# Patient Record
Sex: Female | Born: 2005 | Race: Black or African American | Hispanic: No | Marital: Single | Smoking: Never smoker
Health system: Southern US, Community
[De-identification: ages and names within clinical notes are randomized; demographics above are authoritative.]

## PROBLEM LIST (undated history)

## (undated) DIAGNOSIS — J45909 Unspecified asthma, uncomplicated: Secondary | ICD-10-CM

## (undated) DIAGNOSIS — L309 Dermatitis, unspecified: Secondary | ICD-10-CM

## (undated) DIAGNOSIS — L509 Urticaria, unspecified: Secondary | ICD-10-CM

## (undated) DIAGNOSIS — T783XXA Angioneurotic edema, initial encounter: Secondary | ICD-10-CM

## (undated) HISTORY — DX: Unspecified asthma, uncomplicated: J45.909

## (undated) HISTORY — DX: Urticaria, unspecified: L50.9

## (undated) HISTORY — DX: Dermatitis, unspecified: L30.9

## (undated) HISTORY — DX: Angioneurotic edema, initial encounter: T78.3XXA

---
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
## 2023-01-11 DIAGNOSIS — F330 Major depressive disorder, recurrent, mild: Secondary | ICD-10-CM | POA: Diagnosis not present

## 2023-01-21 DIAGNOSIS — F330 Major depressive disorder, recurrent, mild: Secondary | ICD-10-CM | POA: Diagnosis not present

## 2023-01-24 DIAGNOSIS — F330 Major depressive disorder, recurrent, mild: Secondary | ICD-10-CM | POA: Diagnosis not present

## 2023-01-28 DIAGNOSIS — F330 Major depressive disorder, recurrent, mild: Secondary | ICD-10-CM | POA: Diagnosis not present

## 2023-02-25 ENCOUNTER — Encounter (HOSPITAL_COMMUNITY): Payer: Self-pay | Admitting: Psychiatry

## 2023-02-25 ENCOUNTER — Ambulatory Visit (HOSPITAL_BASED_OUTPATIENT_CLINIC_OR_DEPARTMENT_OTHER): Payer: Medicaid Other | Admitting: Psychiatry

## 2023-02-25 DIAGNOSIS — F411 Generalized anxiety disorder: Secondary | ICD-10-CM | POA: Diagnosis not present

## 2023-02-25 NOTE — Progress Notes (Signed)
Psychiatric Initial Adult Assessment   Patient Identification: Rikia Trinkle MRN:  [id number] Date of Evaluation:  02/25/2023 Referral Source: PCP Chief Complaint:   Chief Complaint  Patient presents with   Establish Care   Visit Diagnosis:    ICD-10-CM   1. GAD (generalized anxiety disorder)  F41.1        Assessment:  Beverly Farley is a 17 y.o. female with a history of insomnia and vitamin D insufficiency who presents in person to [place] at [facility] for initial evaluation on 02/25/2023.    Patient reports experiencing symptoms of decreased sleep, brief periods of increased energy, increased irritability, and possible visual hallucinations in December 2023 lasting 2 to 3 days over a couple episodes.  She denies any reckless behaviors during these episodes and does note increased fatigue as they progressed.  Patient also endorsed a period of low mood in January 2024 secondary to interpersonal stressors.  During this episode she had low mood, fatigue, anhedonia, and amotivation.  She denied any SI/HI or thoughts of self-harm.  Of note patient does have a family history of bipolar disorder in her aunt and grandmother.  Patient was also screened for GAD and did endorse mild symptoms of anxiety.  Psychosocially patient has some instability at home and has not had a consistent bedroom/sleeping arrangement when not at school.  At this time patient meets criteria for mild GAD with further clarity needed to confirm/rule out bipolar/bipolar 2 disorder.  Medication options were discussed however patient declined at this time and will reassess if symptoms recur in the future.  She is connected with a therapist through school who she plans to continue with.  A number of assessments were performed during the evaluation today including  PHQ-9 which they scored a 1 on, GAD-7 which they scored a 6 on, and [place] suicide severity screening which showed no risk.    Plan: -  Continue Therapy through school counselor at [place] - TSH and lipid profile reviewed - Crisis resources reviewed - Follow up in 3-4 months  History of Present Illness:  Jola presents reporting that her PCP was concerned of Bipolar disorder when she was referred here.  Patient notes that at time of initial referral she had not slept for 3-day stretch in December later followed by a 2-day stretch.  There had also been periods before December where she was sleeping for 13-hour stretches.  During the episodes of decreased sleep she did notice having some increased energy, completing more school work, increased irritability, and possibly an episode of hallucinations where she saw footprints on the road while her sister was driving.  Patient reports that while she could not sleep during these episodes the primary reason seem to be her racing thoughts and she was fatigued the following days.  She does not identify any particular triggers for the onset of her symptoms.  The episode resolved by the end of December and patient denies any reoccurrence since.  She did endorse a period of depressed mood in January which she believes is related to interpersonal stressors.  At that time patient had decreased energy, spent more time in her room, lack of motivation, and anhedonia. Patient believes this is in line with her typical reaction to the stressors though does note this was the most severe episode of depression and she has had. In the past when she had felt more depressed she would take longer to complete assignments and may be late to school but would never missed  things completely.  The depressive symptoms resolved by the end of January/beginning of February and she denies ever experiencing any SI or thoughts of self-harm.  Patient had been connected with her RA for a wellness check and with a school counselor during that time.  She has another point with a counselor later this month.  We explored patient's  symptoms and lack of sleep that occurred in December.  As patient stated her mind had difficulty shutting down during those episodes, and while she did have increased energy initially that progressed to fatigue over the couple days of being awake.  It was also noted the patient had unstable housing situation as she had returned from college and no longer had a bedroom.  She ended up moving in with her sister which has been good though the 2 can butt heads.  Patient did note that there is a family history of bipolar disorder in her aunt and her grandmother.  On discussion of patient's symptoms patient expressed that there was some curiosity about why they occurred but denied significant concern.  She notes that she is now back to her baseline and has not had a reoccurrence of the events from December.  We explained that this could potentially be the onset of a bipolar disorder as she does have a family history, symptoms that are somewhat similar, and is in the right age range.  Patient acknowledged this and opted to hold off on medications for now while monitoring for if symptoms were to progress or recur.  We also did explore for anxiety disorders and while patient did score positive for mild anxiety she would prefer to manage it through counseling.  I would hold off on antidepressant due to this in addition to the potential bipolar disorder and risk an antidepressant exacerbating that.  Associated Signs/Symptoms: Depression Symptoms:  anhedonia, anxiety, (Hypo) Manic Symptoms:   None currently though did have decreased sleep and increased energy in December Anxiety Symptoms:  Excessive Worry, Psychotic Symptoms:   Denies PTSD Symptoms: NA  Past Psychiatric History: Patient denies prior psychiatric hospitalizations or suicide attempts. She denies having tried any medications in the past. Did start counseling through school in January has her second session in March.  She denies trying any past  medications.  Substance use denies everything other than caffeine.   Previous Psychotropic Medications: No   Substance Abuse History in the last 12 months:  No.  Consequences of Substance Abuse: NA  Past Medical History:  Past Medical History:  Diagnosis Date   Acute right-sided low back pain without sciatica 03/17/2020   Angioedema of lips 12/31/2018   Asthma    Eczema    Urticaria    No past surgical history on file.  Family Psychiatric History: Her aunt and grandma had a diagnosis of bipolar disorder. Dad has alcohol use disorder.  Family History:  Family History  Problem Relation Age of Onset   Asthma Mother    Fibromyalgia Mother    Obesity Mother    Sleep apnea Mother    GER disease Mother    Depression Mother    Thyroid disease Mother    Heart disease Father    Alcohol abuse Father    Asthma Sister    Fibroids Sister    Birth defects Sister    Bleeding Disorder Sister    Leukemia Sister    Heart disease Sister    Heart disease Maternal Grandmother    Depression Maternal Grandmother    Hyperlipidemia Maternal  Grandmother    Hypertension Maternal Grandfather    Diabetes Paternal Grandmother    Cirrhosis Paternal Grandmother    Cancer Other    Stroke Other     Social History:   Social History   Socioeconomic History   Marital status: Single    Spouse name: Not on file   Number of children: Not on file   Years of education: Not on file   Highest education level: Not on file  Occupational History   Not on file  Tobacco Use   Smoking status: Never   Smokeless tobacco: Never  Vaping Use   Vaping Use: Never used  Substance and Sexual Activity   Alcohol use: Never   Drug use: Never   Sexual activity: Not Currently  Other Topics Concern   Not on file  Social History Narrative   [name] lives with her mom & maternal grandmother.   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  She lives on campus during the semester and with her sister when not at school.  Patient notes that she moved in with her and when she was 13 due to difficulties at home with her parents and uncle.  After returning from first semester of college she lived with her aunt for a period before moving in with her sister due to not having a bedroom at her Aunts.   Allergies:   Allergies  Allergen Reactions   Other     Strong family history of Penicillin allergies, Mother requests that this patient not be given Penicillin.   Mother has hx of anaphylactic reaction, sister has hx of hives and rash.    Metabolic Disorder Labs: Lab Results  Component Value Date   HGBA1C 5.0 04/11/2021   MPG 97 04/11/2021   MPG 111 01/14/2020   No results found for: "PROLACTIN" Lab Results  Component Value Date   CHOL 125 04/11/2021   TRIG 89 10/10/2018   HDL 41 (L) 04/11/2021   CHOLHDL 2.9 10/10/2018   VLDL 11 05/07/2017   LDLCALC 70 10/10/2018   LDLCALC 83 05/07/2017   Lab Results  Component Value Date   TSH 2.760 12/05/2022    Therapeutic Level Labs: No results found for: "LITHIUM" No results found for: "CBMZ" No results found for: "VALPROATE"  Current Medications: Current Outpatient Medications  Medication Sig Dispense Refill   albuterol (VENTOLIN HFA) 108 (90 Base) MCG/ACT inhaler Inhale 1-2 puffs into the lungs every 6 (six) hours as needed for wheezing or shortness of breath. 6.7 g 0   cetirizine (ZYRTEC) 10 MG tablet Take one tablet by mouth once daily for management of hives and allergy symptoms 30 tablet 9    ibuprofen (ADVIL) 600 MG tablet Take 1 tablet (600 mg total) by mouth every 6 (six) hours as needed. 30 tablet 0   No current facility-administered medications for this visit.    Psychiatric Specialty Exam: Review of Systems  There were no vitals taken for this visit.There is  no height or weight on file to calculate BMI.  General Appearance: Well Groomed  Eye Contact:  Good  Speech:  Clear and Coherent and Normal Rate  Volume:  Normal  Mood:  Euthymic  Affect:  Congruent  Thought Process:  Coherent, Goal Directed, and Linear  Orientation:  Full (Time, Place, and Person)  Thought Content:  Logical  Suicidal Thoughts:  No  Homicidal Thoughts:  No  Memory:  Immediate;   Good  Judgement:  Good  Insight:  Fair  Psychomotor Activity:  Normal  Concentration:  Concentration: Good  Recall:  Good  Fund of Knowledge:Fair  Language: Good  Akathisia:  NA    AIMS (if indicated):  not done  Assets:  Communication [place] Talents/Skills Vocational/Educational  ADL's:  Intact  Cognition: WNL  Sleep:  Good   Screenings: GAD-7    Flowsheet Row Office Visit from 02/25/2023 in [facility]  Total GAD-7 Score 6      PHQ2-9    [place] Office Visit from 02/25/2023 in [facility] Office Visit from 12/05/2022 in [place] Office Visit from 04/16/2022 in [place] [facility] [place] [facility] Office Visit from 04/07/2021 in [place] [facility] from 04/25/2020 in [place] at [facility] Total Score '1 4 2 2 1  '$ PHQ-9 Total Score -- '17 10 10 '$ --      [place] Office Visit from 02/25/2023 in [facility] ED from 12/13/2022 in [facility] [place] No Risk No  Risk        Collaboration of Care: Primary Care Provider AEB chart review  Patient/Guardian was advised Release of Information must be obtained prior to any record release in order to collaborate their care with an outside provider. Patient/Guardian was advised if they have not already done so to contact the registration department to sign all necessary forms in order for [place] to release information regarding their care.   Consent: Patient/Guardian gives verbal consent for treatment and assignment of benefits for services provided during this visit. Patient/Guardian expressed understanding and agreed to proceed.   Vista Mink, MD 3/11/20241:45 PM  65 minutes were spent in chart review, interview, psycho education, counseling, medical decision making, coordination of care and long-term prognosis.  Patient was given opportunity to ask question and all concerns and questions were addressed and answers. Excluding separately billable services.   Virtual Visit via Video Note  I connected with [place] [facility] on 02/25/23 at  1:10 PM EDT by a video enabled telemedicine application and verified that I am speaking with the correct person using two identifiers.  Location: Patient: Home Provider: Home office   I discussed the limitations of evaluation and management by telemedicine and the availability of in person appointments. The patient expressed understanding and agreed to proceed.   I discussed the assessment and treatment plan with the patient. The patient was provided an opportunity to ask questions and all were answered. The patient agreed with the plan and demonstrated an understanding of the instructions.   The patient was advised to call back or seek an in-person evaluation if the symptoms worsen or if the condition fails to improve as anticipated.  I provided 45 minutes of non-face-to-face time during this encounter.     Vista Mink, MD  3/11/20242:07 PM

## 2023-04-08 ENCOUNTER — Ambulatory Visit (HOSPITAL_COMMUNITY): Payer: Medicaid Other | Admitting: Psychiatry

## 2023-05-28 ENCOUNTER — Encounter (HOSPITAL_COMMUNITY): Payer: Self-pay

## 2023-05-28 ENCOUNTER — Encounter (HOSPITAL_COMMUNITY): Payer: Medicaid Other | Admitting: Psychiatry

## 2023-05-28 NOTE — Progress Notes (Signed)
This encounter was created in error - please disregard.

## 2023-05-28 NOTE — Progress Notes (Deleted)
BH MD/PA/NP OP Progress Note  05/28/2023 9:27 AM Beverly Farley  MRN:  [id number]  Visit Diagnosis: No diagnosis found.  Assessment: Beverly Farley is a 17 y.o. female with a history of insomnia and vitamin D insufficiency who presented to [facility] at [facility] for initial evaluation on 02/25/2023.    At initial evaluation patient reported experiencing symptoms of decreased sleep, brief periods of increased energy, increased irritability, and possible visual hallucinations in December 2023 lasting 2 to 3 days over a couple episodes.  She denied any reckless behaviors during these episodes and does note increased fatigue as they progressed.  Patient also endorsed a period of low mood in January 2024 secondary to interpersonal stressors.  During this episode she had low mood, fatigue, anhedonia, and amotivation.  She denied any SI/HI or thoughts of self-harm.  Of note patient does have a family history of bipolar disorder in her aunt and grandmother.  Patient was also screened for GAD and did endorse mild symptoms of anxiety.  Psychosocially patient has some instability at home and has not had a consistent bedroom/sleeping arrangement when not at school.  Patient met criteria for mild GAD with further clarity needed to confirm/rule out bipolar/bipolar 2 disorder.  Medication options were discussed however patient declined at this time and will reassess if symptoms recur in the future.  She is connected with a therapist through school who she plans to continue with.  Beverly Farley presents for follow-up evaluation. Today, 05/28/23, patient reports ***  Plan: - Continue Therapy through school counselor at [facility] - TSH and lipid profile reviewed - Crisis resources reviewed - Follow up in 3-4 months    Chief Complaint: No chief complaint on file.  HPI: ***   Past Psychiatric History: Patient denies prior psychiatric hospitalizations or suicide attempts. She denies  having tried any medications in the past. Did start counseling through school in January 2024 has her second session in March.  She denies trying any past medications.  Substance use denies everything other than caffeine.   Past Medical History:  Past Medical History:  Diagnosis Date   Acute right-sided low back pain without sciatica 03/17/2020   Angioedema of lips 12/31/2018   Asthma    Eczema    Urticaria    No past surgical history on file.  Family History:  Family History  Problem Relation Age of Onset   Asthma Mother    Fibromyalgia Mother    Obesity Mother    Sleep apnea Mother    GER disease Mother    Depression Mother    Thyroid disease Mother    Heart disease Father    Alcohol abuse Father    Asthma Sister    Fibroids Sister    Birth defects Sister    Bleeding Disorder Sister    Leukemia Sister    Heart disease Sister    Heart disease Maternal Grandmother    Depression Maternal Grandmother    Hyperlipidemia Maternal Grandmother    Hypertension Maternal Grandfather    Diabetes Paternal Grandmother    Cirrhosis Paternal Grandmother    Cancer Other    Stroke Other     Social History:  Social History   Socioeconomic History   Marital status: Single    Spouse name: Not on file   Number of children: Not on file   Years of education: Not on file   Highest education level: Not on file  Occupational History   Not on file  Tobacco Use  Smoking status: Never   Smokeless tobacco: Never  Vaping Use   Vaping Use: Never used  Substance and Sexual Activity   Alcohol use: Never   Drug use: Never   Sexual activity: Not Currently  Other Topics Concern   Not on file  Social History Narrative   [name] lives with her mom & maternal grandmother.   Social Determinants of Health   Financial Resource Strain: Not on file  Food Insecurity: No Food Insecurity (01/16/2020)   Hunger Vital Sign    Worried About Running Out of Food in the Last Year: Never true     Ran Out of Food in the Last Year: Never true  Transportation Needs: Not on file  Physical Activity: Not on file  Stress: Not on file  Social Connections: Not on file    Allergies:  Allergies  Allergen Reactions   Other     Strong family history of Penicillin allergies, Mother requests that this patient not be given Penicillin.   Mother has hx of anaphylactic reaction, sister has hx of hives and rash.    Current Medications: Current Outpatient Medications  Medication Sig Dispense Refill   albuterol (VENTOLIN HFA) 108 (90 Base) MCG/ACT inhaler Inhale 1-2 puffs into the lungs every 6 (six) hours as needed for wheezing or shortness of breath. 6.7 g 0   cetirizine (ZYRTEC) 10 MG tablet Take one tablet by mouth once daily for management of hives and allergy symptoms 30 tablet 9   ibuprofen (ADVIL) 600 MG tablet Take 1 tablet (600 mg total) by mouth every 6 (six) hours as needed. 30 tablet 0   No current facility-administered medications for this visit.     Psychiatric Specialty Exam: Review of Systems  There were no vitals taken for this visit.There is no height or weight on file to calculate BMI.  General Appearance: {Appearance:22683}  Eye Contact:  {BHH EYE CONTACT:22684}  Speech:  {Speech:22685}  Volume:  {Volume (PAA):22686}  Mood:  {BHH MOOD:22306}  Affect:  {Affect (PAA):22687}  Thought Process:  {Thought Process (PAA):22688}  Orientation:  {BHH ORIENTATION (PAA):22689}  Thought Content: {Thought Content:22690}   Suicidal Thoughts:  {ST/HT (PAA):22692}  Homicidal Thoughts:  {ST/HT (PAA):22692}  Memory:  {BHH MEMORY:22881}  Judgement:  {Judgement (PAA):22694}  Insight:  {Insight (PAA):22695}  Psychomotor Activity:  {Psychomotor (PAA):22696}  Concentration:  {Concentration:21399}  Recall:  {BHH GOOD/FAIR/POOR:22877}  Fund of Knowledge: {BHH GOOD/FAIR/POOR:22877}  Language: {BHH GOOD/FAIR/POOR:22877}  Akathisia:  {BHH YES OR NO:22294}    AIMS (if indicated): {Desc;  done/not:10129}  Assets:  {Assets (PAA):22698}  ADL's:  {BHH ZOX'W:96045}  Cognition: {chl bhh cognition:304700322}  Sleep:  {BHH GOOD/FAIR/POOR:22877}   Metabolic Disorder Labs: Lab Results  Component Value Date   HGBA1C 5.0 04/11/2021   MPG 97 04/11/2021   MPG 111 01/14/2020   No results found for: "PROLACTIN" Lab Results  Component Value Date   CHOL 125 04/11/2021   TRIG 89 10/10/2018   HDL 41 (L) 04/11/2021   CHOLHDL 2.9 10/10/2018   VLDL 11 05/07/2017   LDLCALC 70 10/10/2018   LDLCALC 83 05/07/2017   Lab Results  Component Value Date   TSH 2.760 12/05/2022   TSH 0.790 01/01/2019    Therapeutic Level Labs: No results found for: "LITHIUM" No results found for: "VALPROATE" No results found for: "CBMZ"   Screenings: GAD-7    Flowsheet Row [facility]  Total GAD-7 Score 6      PHQ2-9    Flowsheet  Row Office Visit from 02/25/2023 in [facility] Office Visit from 12/05/2022 in [facility] Office Visit from 04/16/2022 in [place] [facility] Office Visit from 04/07/2021 in [facility] Nutrition from 04/25/2020 in [facility] at [facility] Total Score 1 4 2 2 1   PHQ-9 Total Score -- 17 10 10  --      Flowsheet Row [facility] from 12/13/2022 in [facility] RISK CATEGORY No Risk No Risk       Collaboration of Care: Collaboration of Care: [facility] OP Collaboration of Care:[id number]}  Patient/Guardian was advised Release of Information must be obtained prior to any record release in order to collaborate their care with an outside provider. Patient/Guardian was advised if they have not already done  so to contact the registration department to sign all necessary forms in order for [place] to release information regarding their care.   Consent: Patient/Guardian gives verbal consent for treatment and assignment of benefits for services provided during this visit. Patient/Guardian expressed understanding and agreed to proceed.    Stasia Cavalier, MD 05/28/2023, 9:27 AM   Virtual Visit via Video Note  I connected with Beverly Farley on 05/28/23 at  2:00 PM EDT by a video enabled telemedicine application and verified that I am speaking with the correct person using two identifiers.  Location: Patient: Home Provider: Home Office   I discussed the limitations of evaluation and management by telemedicine and the availability of in person appointments. The patient expressed understanding and agreed to proceed.   I discussed the assessment and treatment plan with the patient. The patient was provided an opportunity to ask questions and all were answered. The patient agreed with the plan and demonstrated an understanding of the instructions.   The patient was advised to call back or seek an in-person evaluation if the symptoms worsen or if the condition fails to improve as anticipated.  I provided *** minutes of non-face-to-face time during this encounter.   Stasia Cavalier, MD

## 2023-06-14 ENCOUNTER — Ambulatory Visit (HOSPITAL_COMMUNITY)
Admission: RE | Admit: 2023-06-14 | Discharge: 2023-06-14 | Disposition: A | Discharge status: Home / Self Care | Payer: Medicaid Other | Source: Ambulatory Visit | Attending: Physician Assistant | Admitting: Physician Assistant

## 2023-06-14 ENCOUNTER — Encounter (HOSPITAL_COMMUNITY): Payer: Self-pay

## 2023-06-14 VITALS — BP 98/65 | HR 77 | Temp 99.4°F | Resp 18 | Ht 63.0 in | Wt 226.0 lb

## 2023-06-14 DIAGNOSIS — M25562 Pain in left knee: Secondary | ICD-10-CM

## 2023-06-14 DIAGNOSIS — M545 Low back pain, unspecified: Secondary | ICD-10-CM | POA: Diagnosis not present

## 2023-06-14 DIAGNOSIS — M25561 Pain in right knee: Secondary | ICD-10-CM

## 2023-06-14 MED ORDER — METHOCARBAMOL 500 MG PO TABS: 500.0000 mg | ORAL_TABLET | Freq: Two times a day (BID) | ORAL | 0 refills | Status: AC

## 2023-06-14 MED ORDER — NAPROXEN 375 MG PO TABS: 375.0000 mg | ORAL_TABLET | Freq: Two times a day (BID) | ORAL | 0 refills | Status: DC

## 2023-06-14 NOTE — Discharge Instructions (Signed)
I am very glad to hear that you have an appointment with orthopedics coming up.  Keep this appointment as scheduled.  Use heat, rest, stretch for additional symptom relief.  Take Naprosyn twice daily.  Do not take additional NSAIDs with this medication including aspirin, ibuprofen/Advil, naproxen/Aleve.  Take Robaxin up to twice a day.  This will make you sleepy so do not drive or drink alcohol with taking it.  Someone should contact you to schedule appoint with primary care.  If you have any worsening symptoms including weakness, numbness or tingling in your legs, going to the bathroom on yourself without noticing it you need to go to the emergency room immediately.
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
## 2023-06-21 ENCOUNTER — Encounter (HOSPITAL_COMMUNITY): Payer: Self-pay

## 2023-07-05 ENCOUNTER — Encounter (HOSPITAL_COMMUNITY): Payer: Self-pay

## 2024-05-26 ENCOUNTER — Encounter: Payer: Self-pay | Admitting: *Deleted

## 2024-06-10 ENCOUNTER — Ambulatory Visit: Admitting: Nurse Practitioner

## 2024-09-11 ENCOUNTER — Ambulatory Visit (INDEPENDENT_AMBULATORY_CARE_PROVIDER_SITE_OTHER): Payer: Self-pay | Admitting: Nurse Practitioner

## 2024-09-11 ENCOUNTER — Encounter: Payer: Self-pay | Admitting: Nurse Practitioner

## 2024-09-11 VITALS — BP 97/55 | HR 63 | Temp 96.8°F | Ht 63.0 in | Wt 190.0 lb

## 2024-09-11 DIAGNOSIS — R112 Nausea with vomiting, unspecified: Secondary | ICD-10-CM

## 2024-09-11 DIAGNOSIS — Z13228 Encounter for screening for other metabolic disorders: Secondary | ICD-10-CM

## 2024-09-11 DIAGNOSIS — E669 Obesity, unspecified: Secondary | ICD-10-CM

## 2024-09-11 DIAGNOSIS — Z1329 Encounter for screening for other suspected endocrine disorder: Secondary | ICD-10-CM

## 2024-09-11 DIAGNOSIS — F419 Anxiety disorder, unspecified: Secondary | ICD-10-CM

## 2024-09-11 DIAGNOSIS — N946 Dysmenorrhea, unspecified: Secondary | ICD-10-CM

## 2024-09-11 DIAGNOSIS — R55 Syncope and collapse: Secondary | ICD-10-CM

## 2024-09-11 DIAGNOSIS — M25561 Pain in right knee: Secondary | ICD-10-CM

## 2024-09-11 DIAGNOSIS — Z130 Encounter for screening for diseases of the blood and blood-forming organs and certain disorders involving the immune mechanism: Secondary | ICD-10-CM

## 2024-09-11 DIAGNOSIS — G8929 Other chronic pain: Secondary | ICD-10-CM

## 2024-09-11 DIAGNOSIS — F32A Depression, unspecified: Secondary | ICD-10-CM

## 2024-09-11 DIAGNOSIS — F1290 Cannabis use, unspecified, uncomplicated: Secondary | ICD-10-CM

## 2024-09-11 DIAGNOSIS — M25562 Pain in left knee: Secondary | ICD-10-CM

## 2024-09-11 DIAGNOSIS — Z1321 Encounter for screening for nutritional disorder: Secondary | ICD-10-CM

## 2024-09-11 DIAGNOSIS — M545 Low back pain, unspecified: Secondary | ICD-10-CM

## 2024-09-11 MED ORDER — IBUPROFEN 600 MG PO TABS: 600.0000 mg | ORAL_TABLET | Freq: Three times a day (TID) | ORAL | 0 refills | Status: AC | PRN

## 2024-09-11 MED ORDER — FLUOXETINE HCL 20 MG PO CAPS: 20.0000 mg | ORAL_CAPSULE | Freq: Every day | ORAL | 3 refills | Status: AC

## 2024-09-11 MED ORDER — ONDANSETRON 4 MG PO TBDP: 4.0000 mg | ORAL_TABLET | Freq: Three times a day (TID) | ORAL | 1 refills | Status: AC | PRN

## 2024-09-11 NOTE — Assessment & Plan Note (Addendum)
 Dysmenorrhea with associated nausea and vomiting Severe dysmenorrhea with nausea and vomiting, unresponsive to over-the-counter medications due to vomiting. Menstrual cycles regular, lasting 5-6 days, lightened over the years. - Refer to gynecologist for further evaluation and management. - Advise use of heating pad and warm fluids for symptomatic relief. Prescribed ibuprofen  600 mg 3 times daily as needed menstrual cramps
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
## 2024-09-11 NOTE — Assessment & Plan Note (Signed)
 Syncope (recurrent fainting episodes) Recurrent syncope since last year, most recent episode a couple of months ago. Episodes preceded by lightheadedness and peripheral vision loss. No chest pain or shortness of breath. Differential includes anxiety or depression-related syncope. - Order laboratory tests to evaluate potential causes of syncope. - Perform EKG to assess cardiac function.  EKG showed sinus bradycardia rate of 59,  - Advise maintaining hydration with 64 ounces of water daily.

## 2024-09-11 NOTE — Assessment & Plan Note (Signed)
 Nausea with dysmenorrhea Prescribed Zofran  4 mg every 8 hours as needed nausea

## 2024-09-11 NOTE — Assessment & Plan Note (Signed)
 Chronic low back pain with history of lumbar disc herniation and bilateral knee pain likely due to osteoarthritis Chronic low back pain with lumbar disc herniation and bilateral knee pain, likely due to osteoarthritis. Pain exacerbated by cold weather and rain, currently at 1/10 severity. Previous physical therapy ineffective, high medication tolerance, ibuprofen  800 mg ineffective, lidocaine patches provide relief. - Refer to orthopedics for further evaluation and management. - Recommend Tylenol  650 mg every six hours as needed for pain.

## 2024-09-11 NOTE — Assessment & Plan Note (Signed)
  Reports use of marijuana, advised against use due to potential health risks. - Advise discontinuation of marijuana use.

## 2024-09-11 NOTE — Patient Instructions (Addendum)
 Ok to take ibuprofen   600 mg every  8 hours as needed for menstrual cramps , take with food.  Take zofran  4 mg every 8 hours as needed for nausea    1. Screening for endocrine, nutritional, metabolic and immunity disorder (Primary)  - CBC - CMP14+EGFR - TSH  2. Syncope, unspecified syncope type  - EKG 12-Lead  3. Anxiety and depression  - FLUoxetine  (PROZAC ) 20 MG capsule; Take 1 capsule (20 mg total) by mouth daily.  Dispense: 90 capsule; Refill: 3 - AMB Referral VBCI Care Management  4. Chronic pain of both knees  - Ambulatory referral to Orthopedic Surgery - ibuprofen  (ADVIL ) 600 MG tablet; Take 1 tablet (600 mg total) by mouth every 8 (eight) hours as needed.  Dispense: 30 tablet; Refill: 0  5. Chronic low back pain without sciatica, unspecified back pain laterality  - Ambulatory referral to Orthopedic Surgery - ibuprofen  (ADVIL ) 600 MG tablet; Take 1 tablet (600 mg total) by mouth every 8 (eight) hours as needed.  Dispense: 30 tablet; Refill: 0  6. Dysmenorrhea  - Ambulatory referral to Gynecology - ondansetron  (ZOFRAN -ODT) 4 MG disintegrating tablet; Take 1 tablet (4 mg total) by mouth every 8 (eight) hours as needed for nausea or vomiting.  Dispense: 20 tablet; Refill: 1 - ibuprofen  (ADVIL ) 600 MG tablet; Take 1 tablet (600 mg total) by mouth every 8 (eight) hours as needed.  Dispense: 30 tablet; Refill: 0  7. Nausea and vomiting, unspecified vomiting type  - ondansetron  (ZOFRAN -ODT) 4 MG disintegrating tablet; Take 1 tablet (4 mg total) by mouth every 8 (eight) hours as needed for nausea or vomiting.  Dispense: 20 tablet; Refill: 1    Behavioral Health Resources:    What if I or someone I know is in crisis?   If you are thinking about harming yourself or having thoughts of suicide, or if you know someone who is, seek help right away.   Call your doctor or mental health care provider.   Call 911 or go to a hospital emergency room to get immediate help, or ask a  friend or family member to help you do these things; IF YOU ARE IN [place], YOU MAY GO TO WALK-IN URGENT CARE 24/7 at [facility] (see below)   Call the [place]  National Suicide Prevention Lifeline's toll-free, 24-hour hotline at 1-800-273-TALK (331) 334-8745) or TTY: 1-800-799-4 TTY 9052276131) to talk to a trained counselor.   If you are in crisis, make sure you are not left alone.    If someone else is in crisis, make sure he or she is not left alone     24 Hour :    [facility]  [address], [place], [place] [address] [phone] or [phone] WALK-IN URGENT CARE 24/7   [facility] Mobile Crisis: [phone]   [place]  National Suicide Hotline: 612-401-5495   [facility] [employer] (Domestic Violence, Rape & Victim Assistance)  [phone]   [facility]  [address], [place]  [address]   [phone] or [phone]    [facility] [employer] [facility]: [phone] (8am-4pm) or [phone] (after hours)          [facility], [address], [place], [place]  [phone] Fax: [phone] [email or web address] *Interpreters available *Accepts all insurance and uninsured for Urgent Care needs *Accepts Medicaid and uninsured for outpatient treatment    [facility]   Mon-Fri:  8am-5pm [address], [place], [place] [phone](eynwz); [phone](fax) [email or web address]  *Accepts Medicare   [facility] Pablo Earlean Everts, Fri: 8am-4pm [address], [place], [place] [address] [phone] (phone); [phone] (fax) [email or web address]  *Accepts Medicare   [facility] Mon-Fri: 9am-5pm  [address], [place], [place] [phone] (phone); [phone]   [email or web address]  *Accepts Medicaid   [facility], 8:30am-12pm/1pm-2:30pm [address], [place], [place] [phone] (phone); [phone] (fax) [email or web address].[email or web address]  *Accepts Medicaid, sliding-scale*Bilingual services available   [facility] Mon-Fri, 8am-7pm [address], [place], [place]  [phone](eynwz); [phone](fax) www.[email or web address]  *Accepts Medicaid *Bilingual services available   [facility] Mon-Fri: 8am-5pm, Saturday by appointment only [address] [place], [place], [place] [phone] (phone); [phone] (fax) [email or web address]    [facility] [address], [place], [place] [phone] [email or web address]  *Free & reduced services for uninsured and underinsured individuals *Bilingual services for Spanish-speaking clients 21 and under   [facility], [address] [place], [place] [phone](eynwz); [phone](fax) [email or web address]  *Bring your own interpreter at first visit *Accepts Medicare and [facility]   [facility] Mon-Fri: 9am-5:30pm [address], [place], [place] [phone] (phone), [phone] (fax) After hours crisis line: [phone] [email or web address]  *Accepts Medicare and Medicaid   [employer], 8am-6pm [address] [place], [place]  [phone] (phone); [phone] (fax) [email or web address]://[email or web address]  *Subsidized costs available   [facility] Mon-Fri: 8am-4pm [address], [place], [place] [phone](eynwz); [phone](fax) [email or web address]  *Accepts Medicaid   [facility] Same day access hours: Mon-Fri, 8:30-3pm Crisis hours: Mon-Fri, 8am-5pm [address], [place], [place] [phone]    [facility] [employer] Same day access hours: Mon-Fri, 8:30-3pm Crisis hours: Mon-Fri, 8am-8pm [address] [place], [place] [phone] (phone); [phone] (fax) www.[email or web address]  *Accepts Medicaid and Medicare   [facility], [place], Fri: 9am-9pm Tues, Thurs: 9am-6pm [address] [place], [place], [place]  [phone] (phone); [phone] (fax) [email or web address]  *(Accepts Medicare and Medicaid; payment plans available)*Bilingual services available   [facility] [address] [place], [place] [phone] (phone); [phone] (fax) [email or web address]    [facility] [address], [place], [place]  [phone]  [email or web address]  *Bilingual services available   [facility]) Mon-Thurs: 8am-8pm [address], [place], [place] [phone] (phone); [phone] (fax) [email or web address]  *Accepts Medicaid*Bilingual services available   [facility] [address] [place], [place] [phone] (phone) [email or web address]  *Accepts Medicaid *Bilingual services available   [facility] Mon-Fri, 9am-4:45pm [address], [place], [place] [phone] (phone); [phone] (fax) [email or web address]  *Accepts Medicare   [facility] Mon-Thurs: 8:30-8pm, Fri: 8:30am-7pm [address], [place], [place] (3rd floor) [phone] (phone); [phone] (fax) [email or web address]  *Accepts Medicaid; income-based reduced rates available   [facility] Mon-Fri: 8am-5pm [address], [place], [place] [address] [phone] (phone); [phone] (fax) [email or web address]  *Accepts Medicaid*Bilingual services available     [facility] [place])  [address], [place] [phone]  www.[email or web address]  *Provides direct services to individuals in recovery from mental illness, including support groups, recovery skills classes, and one on one peer support   [employer] on Mental Illness) [place] [facility] helpline: (445)373-2054  NAMI  helpline: 949-219-5482 https://[email or web address]  *A community hub for information relating to local resources and services for the friends and families of individuals living alongside a mental health condition, as well as the individuals themselves.  Classes and support groups also provided             It is important that you exercise regularly at least 30 minutes 5 times a week as tolerated  Think about what you will eat, plan ahead. Choose  clean, green, fresh or frozen over canned, processed or packaged foods which are more sugary, salty and fatty. 70 to 75% of food eaten should be vegetables and fruit. Three meals at set times with snacks allowed between meals, but they must be fruit or vegetables. Aim to eat over a 12 hour period , example 7 am to 7 pm, and STOP after  your last meal of the day. Drink water,generally about 64 ounces per day, no other drink is as healthy. Fruit juice is best enjoyed in a healthy way, by EATING the fruit.  Thanks for choosing [facility] we consider it a privelige to serve you.

## 2024-09-11 NOTE — Assessment & Plan Note (Addendum)
 Flowsheet Row Office Visit from 09/11/2024 in [facility] - A Dept Of [facility]  PHQ-9 Total Score 11       09/11/2024   11:59 AM 02/25/2023    1:42 PM  GAD 7 : Generalized Anxiety Score  Nervous, Anxious, on Edge 2   Control/stop worrying 0   Worry too much - different things 1   Trouble relaxing 1   Restless 1   Easily annoyed or irritable 1   Afraid - awful might happen 1   Total GAD 7 Score 7   Anxiety Difficulty Very difficult      Information is confidential and restricted. Go to Review Flowsheets to unlock data.   Major depressive disorder and anxiety disorder. Previous therapy discontinued due to confidentiality concerns. Open to medication and therapy. Prozac  initiated, potential side effects include nausea and vomiting. Full therapeutic effect expected in 4-6 weeks. Urgent care options discussed for self-harm prevention. - Prescribe Prozac  20mg  daily for depression and anxiety. - Refer to counseling for therapy. - Advise seeking urgent care or calling 911 if experiencing thoughts of self-harm.

## 2024-09-12 LAB — CMP14+EGFR
ALT: 6 IU/L (ref 0–32)
AST: 16 IU/L (ref 0–40)
Albumin: 4.1 g/dL (ref 4.0–5.0)
Alkaline Phosphatase: 48 IU/L (ref 42–106)
BUN/Creatinine Ratio: 11 (ref 9–23)
BUN: 7 mg/dL (ref 6–20)
Bilirubin Total: 0.3 mg/dL (ref 0.0–1.2)
CO2: 21 mmol/L (ref 20–29)
Calcium: 8.9 mg/dL (ref 8.7–10.2)
Chloride: 105 mmol/L (ref 96–106)
Creatinine, Ser: 0.62 mg/dL (ref 0.57–1.00)
Globulin, Total: 3.4 g/dL (ref 1.5–4.5)
Glucose: 83 mg/dL (ref 70–99)
Potassium: 4.2 mmol/L (ref 3.5–5.2)
Sodium: 137 mmol/L (ref 134–144)
Total Protein: 7.5 g/dL (ref 6.0–8.5)
eGFR: 131 mL/min/1.73 (ref >59)

## 2024-09-12 LAB — CBC
Hematocrit: 29.6 % — ABNORMAL LOW (ref 34.0–46.6)
Hemoglobin: 8 g/dL — ABNORMAL LOW (ref 11.1–15.9)
MCH: 18.3 pg — ABNORMAL LOW (ref 26.6–33.0)
MCHC: 27 g/dL — ABNORMAL LOW (ref 31.5–35.7)
MCV: 68 fL — ABNORMAL LOW (ref 79–97)
Platelets: 509 x10E3/uL — ABNORMAL HIGH (ref 150–450)
RBC: 4.38 x10E6/uL (ref 3.77–5.28)
RDW: 16 % — ABNORMAL HIGH (ref 11.7–15.4)
WBC: 4.2 x10E3/uL (ref 3.4–10.8)

## 2024-09-12 LAB — TSH: TSH: 1.46 u[IU]/mL (ref 0.450–4.500)

## 2024-09-12 LAB — VITAMIN D 25 HYDROXY (VIT D DEFICIENCY, FRACTURES): Vit D, 25-Hydroxy: 12.9 ng/mL — ABNORMAL LOW (ref 30.0–100.0)

## 2024-09-14 ENCOUNTER — Telehealth: Payer: Self-pay | Admitting: *Deleted

## 2024-09-14 ENCOUNTER — Ambulatory Visit: Payer: Self-pay | Admitting: Nurse Practitioner

## 2024-09-14 DIAGNOSIS — D649 Anemia, unspecified: Secondary | ICD-10-CM

## 2024-09-14 DIAGNOSIS — N946 Dysmenorrhea, unspecified: Secondary | ICD-10-CM

## 2024-09-14 DIAGNOSIS — E559 Vitamin D deficiency, unspecified: Secondary | ICD-10-CM

## 2024-09-14 MED ORDER — VITAMIN D (ERGOCALCIFEROL) 1.25 MG (50000 UNIT) PO CAPS: 50000.0000 [IU] | ORAL_CAPSULE | ORAL | 0 refills | Status: AC

## 2024-09-14 MED ORDER — FERROUS SULFATE 325 (65 FE) MG PO TBEC: 325.0000 mg | DELAYED_RELEASE_TABLET | Freq: Every day | ORAL | 1 refills | Status: AC

## 2024-09-14 NOTE — Progress Notes (Unsigned)
 Complex Care Management Note Care Guide Note  09/14/2024 Name: Beverly Farley MRN: [id number] DOB: Jul 18, 2006   Complex Care Management Outreach Attempts: An unsuccessful telephone outreach was attempted today to offer the patient information about available complex care management services.  Follow Up Plan:  Additional outreach attempts will be made to offer the patient complex care management information and services.   Encounter Outcome:  No Answer  Harlene Satterfield  [facility]  [facility], [facility] Guide  Direct Dial: [phone]  Fax [phone]

## 2024-09-15 NOTE — Progress Notes (Unsigned)
 Complex Care Management Note Care Guide Note  09/15/2024 Name: Omaya Nieland MRN: [id number] DOB: 08-29-2006   Complex Care Management Outreach Attempts: A second unsuccessful outreach was attempted today to offer the patient with information about available complex care management services.  Follow Up Plan:  Additional outreach attempts will be made to offer the patient complex care management information and services.   Encounter Outcome:  No Answer  Harlene Satterfield  [facility]  [facility] Guide  Direct Dial: [phone]  Fax [phone]

## 2024-09-16 NOTE — Progress Notes (Signed)
 Complex Care Management Note Care Guide Note  09/16/2024 Name: Beverly Farley MRN: [id number] DOB: 2006/05/12   Complex Care Management Outreach Attempts: A third unsuccessful outreach was attempted today to offer the patient with information about available complex care management services.  Follow Up Plan:  No further outreach attempts will be made at this time. We have been unable to contact the patient to offer or enroll patient in complex care management services.  Encounter Outcome:  No Answer  Harlene Satterfield  [facility]  [facility], [facility] Guide  Direct Dial: [phone]  Fax [phone]

## 2024-09-22 LAB — IRON,TIBC AND FERRITIN PANEL
Ferritin: 7 ng/mL — ABNORMAL LOW (ref 15–150)
Iron Saturation: 2 % — CL (ref 15–55)
Iron: 14 ug/dL — ABNORMAL LOW (ref 27–159)
Total Iron Binding Capacity: 642 ug/dL (ref 250–450)
UIBC: 628 ug/dL — ABNORMAL HIGH (ref 131–425)

## 2024-09-22 LAB — B12 AND FOLATE PANEL
Folate: 5 ng/mL (ref >3.0)
Vitamin B-12: 924 pg/mL (ref 232–1245)

## 2024-09-22 LAB — SPECIMEN STATUS REPORT

## 2024-11-02 ENCOUNTER — Ambulatory Visit: Payer: Self-pay | Admitting: Nurse Practitioner

## 2024-12-25 ENCOUNTER — Ambulatory Visit: Payer: Self-pay | Admitting: Sports Medicine
